# Patient Record
Sex: Male | Born: 1995 | Hispanic: No | Marital: Single | Smoking: Never smoker
Health system: Southern US, Community
[De-identification: ages and names within clinical notes are randomized; demographics above are authoritative.]

---
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
## 2017-01-21 IMAGING — CT CT HEAD W/O CM
2 series · 14 of 30 positions shown, 16 images · non-contrast
Comparison: Brain CT 10/28/2011

CLINICAL DATA: Patient status post trip and fall after being chased
by dog. Injury to the back of the head. No reported loss
consciousness.

EXAM:
CT HEAD WITHOUT CONTRAST
TECHNIQUE: Contiguous axial images were obtained from the base of the skull
through the vertex without intravenous contrast.

[Series 2: soft tissue · axial · 0.42mm/px · z∈[+1090,+1190]mm · 6 of 29 slices shown, 8 images]
[im 5/29  brain]
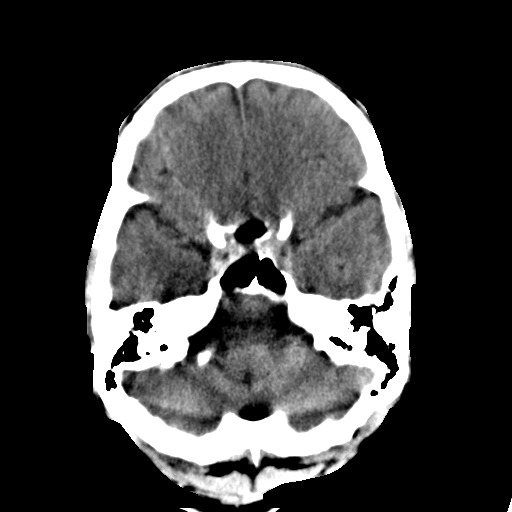
[im 5/29  bone]
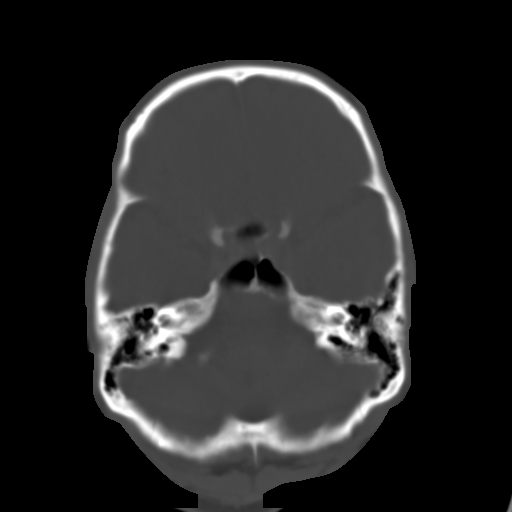
[im 9/29  brain]
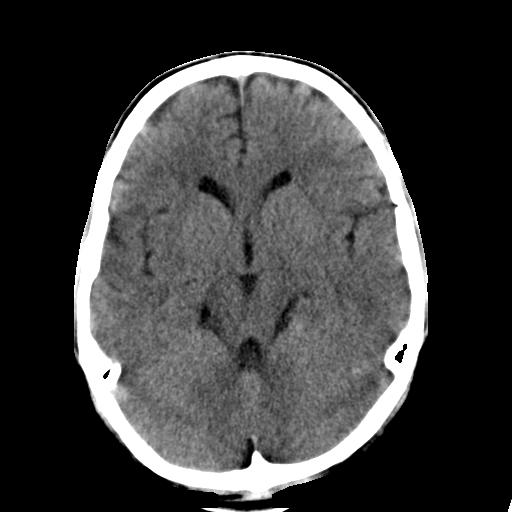
[im 13/29  brain]
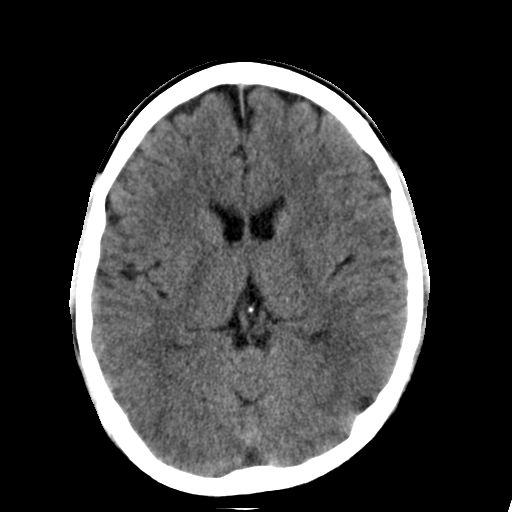
[im 17/29  brain]
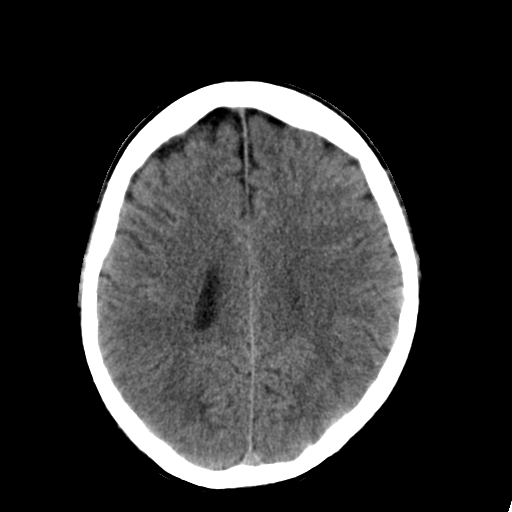
[im 21/29  brain]
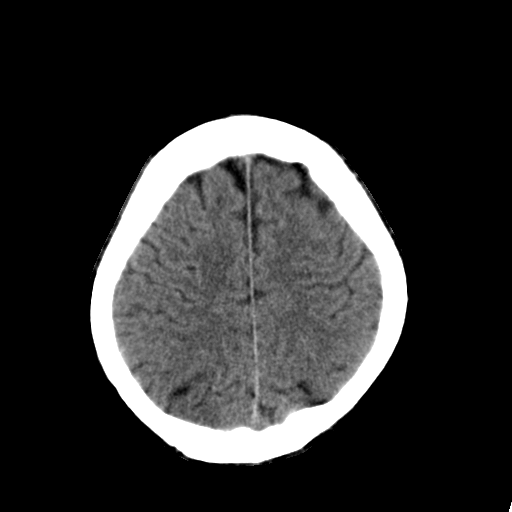
[im 21/29  bone]
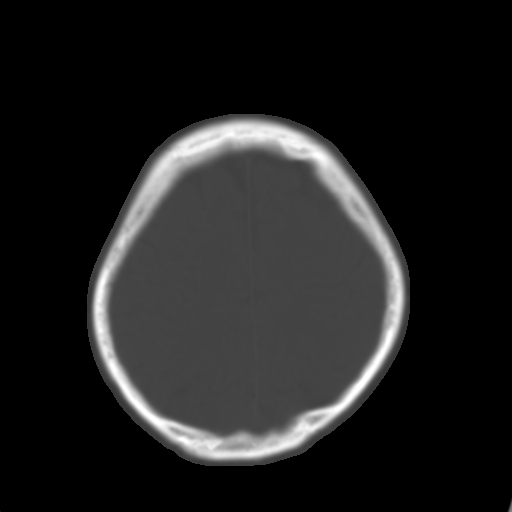
[im 25/29  brain]
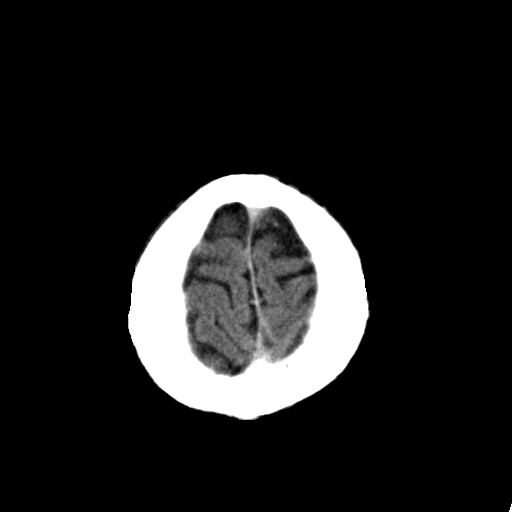

[Series 3: bone · axial · 0.40mm/px · z∈[+1073,+1205]mm · 8 of 82 slices shown]
[im 8/82  bone]
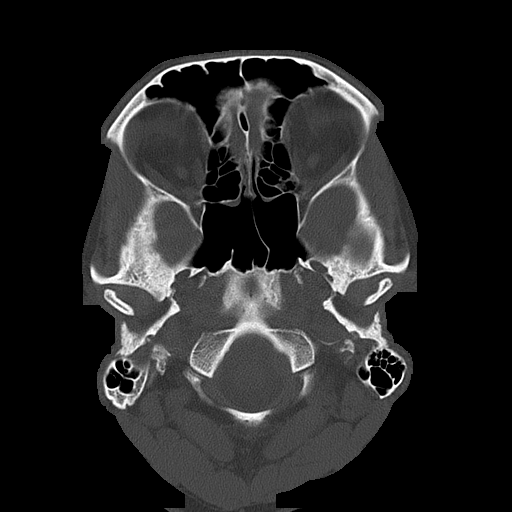
[im 16/82  bone]
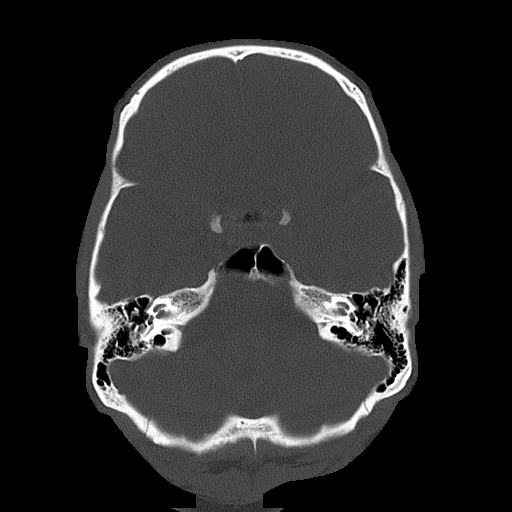
[im 28/82  bone]
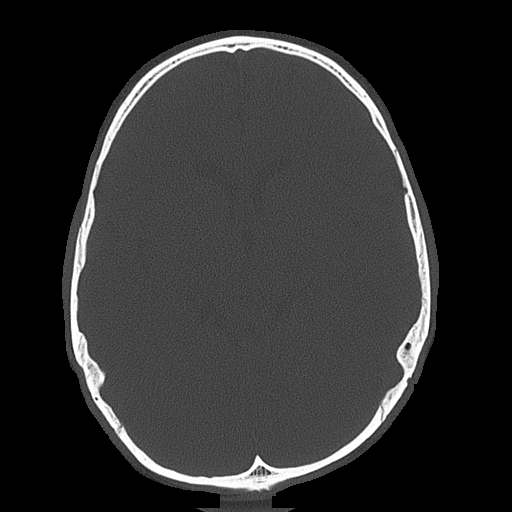
[im 35/82  bone]
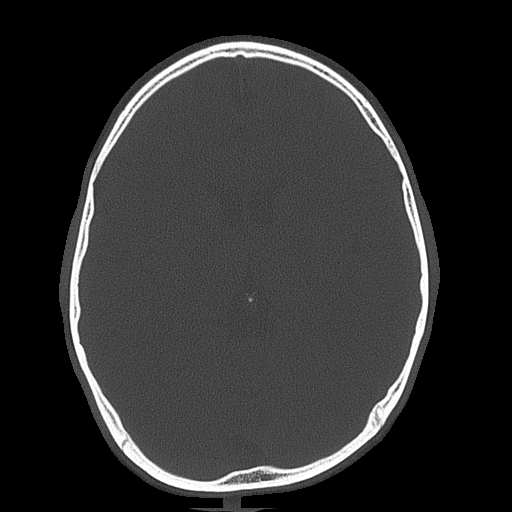
[im 47/82  bone]
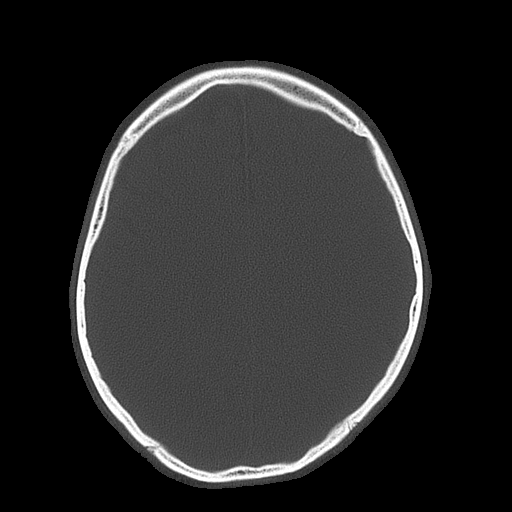
[im 55/82  bone]
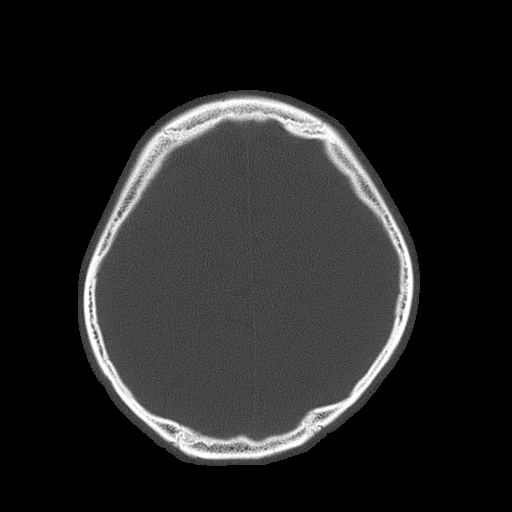
[im 66/82  bone]
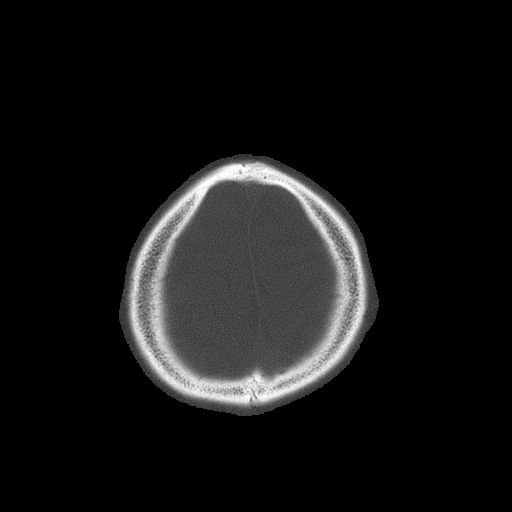
[im 74/82  bone]
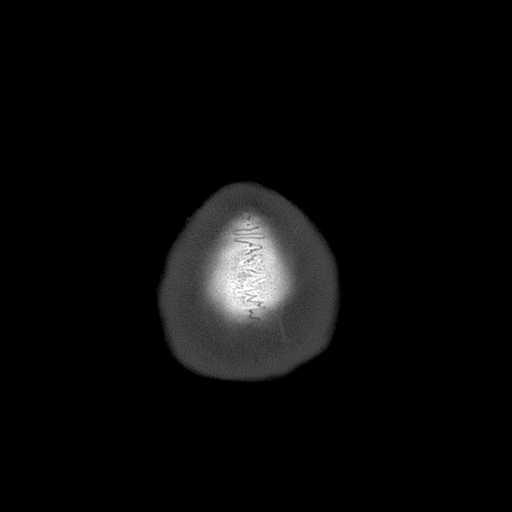

[14 of 30 positions shown; findings below may reference images not displayed]

FINDINGS: Ventricles and sulci are appropriate for patient's age. No evidence
for acute cortically based infarct, intracranial hemorrhage, mass
lesion or mass-effect. Visualized paranasal sinuses are
unremarkable. Mastoid air cells are well aerated. Calvarium is
intact.
IMPRESSION: No acute intracranial process.
